# Patient Record
Sex: Male | Born: 1965 | Race: White | Hispanic: No | Marital: Single | Smoking: Former smoker
Health system: Southern US, Community
[De-identification: ages and names within clinical notes are randomized; demographics above are authoritative.]

## PROBLEM LIST (undated history)

## (undated) DIAGNOSIS — G250 Essential tremor: Secondary | ICD-10-CM

## (undated) HISTORY — PX: LUMBAR SPINE SURGERY: SHX701

## (undated) HISTORY — DX: Essential tremor: G25.0

---

## 2008-08-22 ENCOUNTER — Emergency Department (HOSPITAL_COMMUNITY): Admission: EM | Admit: 2008-08-22 | Discharge: 2008-08-22 | Payer: Self-pay | Admitting: Emergency Medicine

## 2018-11-17 ENCOUNTER — Other Ambulatory Visit: Payer: Self-pay

## 2018-11-17 DIAGNOSIS — Z20822 Contact with and (suspected) exposure to covid-19: Secondary | ICD-10-CM

## 2018-11-19 LAB — NOVEL CORONAVIRUS, NAA: SARS-CoV-2, NAA: NOT DETECTED

## 2018-12-23 ENCOUNTER — Other Ambulatory Visit: Payer: Self-pay | Admitting: Internal Medicine

## 2018-12-23 DIAGNOSIS — M5442 Lumbago with sciatica, left side: Secondary | ICD-10-CM

## 2019-01-06 ENCOUNTER — Ambulatory Visit
Admission: RE | Admit: 2019-01-06 | Discharge: 2019-01-06 | Disposition: A | Discharge status: Home / Self Care | Payer: PRIVATE HEALTH INSURANCE | Source: Ambulatory Visit | Attending: Internal Medicine | Admitting: Internal Medicine

## 2019-01-06 ENCOUNTER — Other Ambulatory Visit: Payer: Self-pay

## 2019-01-06 DIAGNOSIS — M5442 Lumbago with sciatica, left side: Secondary | ICD-10-CM | POA: Insufficient documentation

## 2019-02-04 ENCOUNTER — Encounter: Payer: Self-pay | Admitting: Neurology

## 2019-02-23 ENCOUNTER — Encounter: Payer: Self-pay | Admitting: Neurology

## 2019-02-23 NOTE — Progress Notes (Signed)
Stephen Austin was seen today in the movement disorders clinic for neurologic consultation at the request of Barnett Abu, MD.  The consultation is for the evaluation of tremor. The records that were made available to me were reviewed. Patient had lumbar spinal surgery at the end of October.  Following that surgery, he noted worsening of tremor in his left hand but pt states that it was always there.  He did have baseline tremor, and I noticed records from his primary care physician from September 9 that mentioned essential tremor.  In fact, he was put on propranolol several years ago, but felt it did not do anything and he discontinued the medication.  He was restarted on this by Dr. Danielle Dess on February 04, 2019.,  20 mg twice per day.  It is not helping.  No SE with the medication.    Tremor: Yes.     How long has it been going on? 5+ years  At rest or with activation?  activation  When is it noted the most?  Writing/eating/driving  Fam hx of tremor?  Yes.  , Mother  Located where?  L>R hand and he is L handed  Affected by caffeine:  No. (drinks 4 diet cokes per day)  Affected by alcohol:  Only drinks 1 time per month - not enough to know if changes tremor  Affected by stress:  Yes.    Affected by fatigue:  No.  Spills soup if on spoon:  Yes.    Spills glass of liquid if full: may or may not  Affects ADL's (tying shoes, brushing teeth, etc):  No. but shaving is a challenge (now shaving with electric because of it)  Tremor inducing meds:  No.  Neuroimaging of the brain has not previously been performed.    PREVIOUS MEDICATIONS: propranolol; tried something else but not sure what  ALLERGIES:   Allergies  Allergen Reactions  . Metoprolol Other (See Comments)    Wheezing    CURRENT MEDICATIONS:  Current Outpatient Medications  Medication Instructions  . celecoxib (CELEBREX) 200 MG capsule Oral  . meloxicam (MOBIC) 7.5 mg, Oral, Daily  . propranolol (INDERAL) 20 mg, Oral, 2 times daily     PAST MEDICAL HISTORY:   Past Medical History:  Diagnosis Date  . Essential tremor     PAST SURGICAL HISTORY:   Past Surgical History:  Procedure Laterality Date  . LUMBAR SPINE SURGERY      SOCIAL HISTORY:   Social History   Socioeconomic History  . Marital status: Single    Spouse name: Not on file  . Number of children: Not on file  . Years of education: Not on file  . Highest education level: Not on file  Occupational History  . Not on file  Social Needs  . Financial resource strain: Not on file  . Food insecurity    Worry: Not on file    Inability: Not on file  . Transportation needs    Medical: Not on file    Non-medical: Not on file  Tobacco Use  . Smoking status: Former Games developer  . Smokeless tobacco: Current User    Types: Snuff  Substance and Sexual Activity  . Alcohol use: Yes    Comment: 1 time per month  . Drug use: Not on file  . Sexual activity: Not on file  Lifestyle  . Physical activity    Days per week: Not on file    Minutes per session: Not on file  .  Stress: Not on file  Relationships  . Social Herbalist on phone: Not on file    Gets together: Not on file    Attends religious service: Not on file    Active member of club or organization: Not on file    Attends meetings of clubs or organizations: Not on file    Relationship status: Not on file  . Intimate partner violence    Fear of current or ex partner: Not on file    Emotionally abused: Not on file    Physically abused: Not on file    Forced sexual activity: Not on file  Other Topics Concern  . Not on file  Social History Narrative   Left handed    FAMILY HISTORY:   Family Status  Relation Name Status  . Mother  Alive  . Father  Deceased  . Brother  Alive  . Child 2 Alive    ROS:  Review of Systems  Constitutional: Negative.   HENT: Negative.   Eyes: Negative.   Respiratory: Negative.   Cardiovascular: Negative.   Gastrointestinal: Negative.    Genitourinary: Negative.   Musculoskeletal: Positive for back pain (improving since sx).  Skin: Negative.   Endo/Heme/Allergies: Negative.     PHYSICAL EXAMINATION:    VITALS:   Vitals:   02/24/19 1327  BP: 129/72  Pulse: 80  Resp: 16  SpO2: 98%  Weight: 222 lb 3.2 oz (100.8 kg)  Height: 5\' 10"  (1.778 m)    GEN:  The patient appears stated age and is in NAD. HEENT:  Normocephalic, atraumatic.  The mucous membranes are moist. The superficial temporal arteries are without ropiness or tenderness. CV:  RRR Lungs:  CTAB Neck/HEME:  There are no carotid bruits bilaterally.  Neurological examination:  Orientation: The patient is alert and oriented x3. Fund of knowledge is appropriate.  Recent and remote memory are intact.  Attention and concentration are normal.    Able to name objects and repeat phrases. Cranial nerves: There is good facial symmetry. . Extraocular muscles are intact. The visual fields are full to confrontational testing. The speech is fluent and clear. Soft palate rises symmetrically and there is no tongue deviation. Hearing is intact to conversational tone. Sensation: Sensation is intact to light and pinprick throughout (facial, trunk, extremities). Vibration is intact at the bilateral big toe. There is no extinction with double simultaneous stimulation. There is no sensory dermatomal level identified. Motor: Strength is 5/5 in the bilateral upper and lower extremities.   Shoulder shrug is equal and symmetric.  There is no pronator drift. Deep tendon reflexes: Deep tendon reflexes are 2/4 at the bilateral biceps, triceps, brachioradialis, patella and achilles. Plantar responses are downgoing bilaterally.  Movement examination: Tone: There is normal tone in the bilateral upper extremities.  The tone in the lower extremities is normal.  Abnormal movements: There is no rest tremor, even with distraction procedures.  There is mild postural tremor that can become moderate  on the left, if the arm is held in the proximal position.  He does have trouble with Archimedes spirals, especially on the left.  He has mild trouble pouring water from 1 glass to another. Coordination:  There is no decremation with RAM's, with any form of RAMS, including alternating supination and pronation of the forearm, hand opening and closing, finger taps, heel taps and toe taps. Gait and Station: The patient has no difficulty arising out of a deep-seated chair without the use of the hands.  The patient's stride length is good but gait is a bit antalgic from recent back sx.  Lab work is reviewed.  Chemistry on October 08, 2018 demonstrated sodium 139, potassium 4.1, chloride 102, CO2 26, BUN 12, creatinine 1.1, calcium 9.7, AST 21, ALT 29, glucose 88.  Last TSH was from 2015 and was 0.716.   ASSESSMENT/PLAN:  1.  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  -Discussed that essential tremor can be asymmetric in about 30% of cases.  I do not see any evidence of a neurodegenerative process.  He has had this for over 5 years.  -For now, would recommend continuing the propranolol, 20 mg twice per day.  This can be synergistic with primidone, which we are going to start.  -We will cautiously start primidone and work to 50 mg twice per day.  He was given a titration schedule.  Discussed risk, benefits, and side effects.  -Discussed DBS therapy, although I do not think that he necessarily needs that right now.  Was shown HIPAA compliant videos of patients who have had DBS.  I talked to the patient about the logistics associated with DBS therapy.  I talked to the patient about risks/benefits/side effects of DBS therapy.  We talked about risks which included but were not limited to infection, paralysis, intraoperative seizure, death, stroke, bleeding around the electrode.   I talked to patient about fiducial placement 1 week prior to DBS therapy.  I talked to the patient about what to expect in the operating  room, including the fact that this is an awake surgery.  We talked about battery placement as well as which is done under general anesthesia, generally approximately one week following the initial surgery.  We also talked about the fact that the patient will need to be off of medications for surgery.  The patient was given the opportunity to ask questions, which they did, and I answered them to the best of my ability today.   -Once the patient left, I realized that he had not had his TSH checked since 2015.  Would recommend doing this.  We will call him back and let him know.  2.  LBP  -s/p recent surgery with Dr. Danielle DessElsner.  He is slowly improving.  3.  Follow up is anticipated in the next 4-6 months, sooner should new neurologic issues arise.  Much greater than 50% of this visit was spent in counseling and coordinating care.  Total face to face time: 60 min  Cc:  Lauro RegulusAnderson, Marshall W, MD

## 2019-02-24 ENCOUNTER — Telehealth: Payer: Self-pay

## 2019-02-24 ENCOUNTER — Other Ambulatory Visit: Payer: Self-pay

## 2019-02-24 ENCOUNTER — Encounter: Payer: Self-pay | Admitting: Neurology

## 2019-02-24 ENCOUNTER — Ambulatory Visit: Payer: PRIVATE HEALTH INSURANCE | Admitting: Neurology

## 2019-02-24 VITALS — BP 129/72 | HR 80 | Resp 16 | Ht 70.0 in | Wt 222.2 lb

## 2019-02-24 DIAGNOSIS — G250 Essential tremor: Secondary | ICD-10-CM

## 2019-02-24 DIAGNOSIS — M545 Low back pain, unspecified: Secondary | ICD-10-CM

## 2019-02-24 MED ORDER — PRIMIDONE 50 MG PO TABS: 50.0000 mg | ORAL_TABLET | Freq: Two times a day (BID) | ORAL | 1 refills | Status: DC

## 2019-02-24 NOTE — Telephone Encounter (Signed)
-----   Message from Finney, DO sent at 02/24/2019  3:25 PM EST ----- Will you apologize to patient and tell him that I realized after he left that his TSH has not been checked since 2015 and would recommend checking that given tremor.  He can stop by at his convenience to get done.  Dx:  tremor

## 2019-02-24 NOTE — Patient Instructions (Signed)
Start primidone 50 mg - 1/2 tablet at bedtime for 1 week and then increase to 1 tablet for one month and if you are still having tremor you can then increase to 1 tablet twice per day.  Continue propranolol 20 mg twice per day  The physicians and staff at [facility] are committed to providing excellent care. You may receive a survey requesting feedback about your experience at our office. We strive to receive "very good" responses to the survey questions. If you feel that your experience would prevent you from giving the office a "very good " response, please contact our office to try to remedy the situation. We may be reached at [phone]. Thank you for taking the time out of your busy day to complete the survey.

## 2019-03-26 ENCOUNTER — Other Ambulatory Visit: Payer: Self-pay | Admitting: Neurological Surgery

## 2019-03-26 DIAGNOSIS — M5416 Radiculopathy, lumbar region: Secondary | ICD-10-CM

## 2019-04-01 ENCOUNTER — Ambulatory Visit
Admission: RE | Admit: 2019-04-01 | Discharge: 2019-04-01 | Disposition: A | Discharge status: Home / Self Care | Payer: PRIVATE HEALTH INSURANCE | Source: Ambulatory Visit | Attending: Neurological Surgery | Admitting: Neurological Surgery

## 2019-04-01 ENCOUNTER — Other Ambulatory Visit: Payer: Self-pay

## 2019-04-01 DIAGNOSIS — M5416 Radiculopathy, lumbar region: Secondary | ICD-10-CM | POA: Diagnosis present

## 2019-04-01 MED ORDER — GADOBUTROL 1 MMOL/ML IV SOLN
10.0000 mL | Freq: Once | INTRAVENOUS | Status: AC | PRN
  Administered 2019-04-01: 10 mL via INTRAVENOUS

## 2019-07-13 ENCOUNTER — Other Ambulatory Visit
Admission: RE | Admit: 2019-07-13 | Discharge: 2019-07-13 | Disposition: A | Discharge status: Home / Self Care | Payer: PRIVATE HEALTH INSURANCE | Source: Ambulatory Visit | Attending: Internal Medicine | Admitting: Internal Medicine

## 2019-07-13 DIAGNOSIS — R55 Syncope and collapse: Secondary | ICD-10-CM | POA: Insufficient documentation

## 2019-07-13 LAB — TROPONIN I (HIGH SENSITIVITY): Troponin I (High Sensitivity): <2 ng/L (ref <18)

## 2019-07-13 LAB — FIBRIN DERIVATIVES D-DIMER (ARMC ONLY): Fibrin derivatives D-dimer (ARMC): 247.56 ng/mL (FEU) (ref 0.00–499.00)

## 2019-07-15 ENCOUNTER — Encounter: Payer: Self-pay | Admitting: Neurology

## 2019-07-15 NOTE — Progress Notes (Signed)
Virtual Visit Via Video   The purpose of this virtual visit is to provide medical care while limiting exposure to the novel coronavirus.    Consent was obtained for video visit:  Yes.   Answered questions that patient had about telehealth interaction:  Yes.   I discussed the limitations, risks, security and privacy concerns of performing an evaluation and management service by telemedicine. I also discussed with the patient that there may be a patient responsible charge related to this service. The patient expressed understanding and agreed to proceed.  Pt location: Home Physician Location: home Name of referring provider:  Lauro Regulus, MD I connected with Baldo Daub at patients initiation/request on 07/16/2019 at  1:00 PM EDT by video enabled telemedicine application and verified that I am speaking with the correct person using two identifiers. Pt MRN:  [id number] Pt DOB:  1965-07-01 Video Participants:  Baldo Daub;    Assessment/Plan:   1.  ET, asymmetric, Left  -increased primidone, 50 mg, 3 in the AM (having trouble remembering bid).  Try this on weekend when not working.  R/B/SE were discussed.  The opportunity to ask questions was given and they were answered to the best of my ability.  The patient expressed understanding and willingness to follow the outlined treatment protocols.  -continue propranolol 20 mg bid, although told to monior BP.  This is being RX by pcp (not started by [place]).  Pt had recent syncopal episode  2.  Syncope  -following with PCP  -stress test abnormal.  Pending appt with cardiology.  Subjective   pt f/u for Essential tremor.  My previous records as well as any outside records made available were reviewed prior to todays visit.  On propranolol.  Primidone started last visit.  Reports that tremor is about the same..  Pt denies falls.   No hallucinations.  Mood has been good.  After pt left last visit, we called him back same day to tel him he needed  TSH checked.  Never did come and have that done, but pt doesn't recall needing to have it done..  I was able to pull and review labs from PCP from 07/11/19 but no tsh included.  States that he did have a syncopal episode on Friday.  He was sitting in his recliner and got up and used the restroom.  He felt dizzy.  He was in the process of urinating when it happened.  He passed out and got up from the floor and he passed out 2 more times.  He was referred to cardiology after abnormal stress.  I don't have a copy of that.  He doesn't know what his BP was.  EMS was contacted.  He states that he doesn't think propranolol is lowering BP - "I've been on this medication before without a problem"  Current movement d/o meds: Propranolol 20 mg bid Primidone 50 mg bid   Prior meds:  Propranolol, 20 mg bid   Current Outpatient Medications on File Prior to Visit  Medication Sig Dispense Refill  . primidone (MYSOLINE) 50 MG tablet Take 1 tablet (50 mg total) by mouth 2 (two) times daily. 180 tablet 1  . propranolol (INDERAL) 20 MG tablet Take 20 mg by mouth 2 (two) times daily.     No current facility-administered medications on file prior to visit.     Objective   Vitals:   07/15/19 1504  Weight: 205 lb (93 kg)  Height: 5\' 11"  (1.803 m)  GEN:  The patient appears stated age and is in NAD.  Neurological examination:  Orientation: The patient is alert and oriented x3. Cranial nerves: There is good facial symmetry. There is no facial hypomimia.  The speech is fluent and clear   Hearing is intact to conversational tone. Motor: Strength is at least antigravity x 4.   Shoulder shrug is equal and symmetric.  There is no pronator drift.  Movement examination: Tone: unable Abnormal movements: there is LUE postural and intention tremor.   Coordination:  There is no decremation with RAM's,     Follow up Instructions      -I discussed the assessment and treatment plan with the patient. The patient  was provided an opportunity to ask questions and all were answered. The patient agreed with the plan and demonstrated an understanding of the instructions.   The patient was advised to call back or seek an in-person evaluation if the symptoms worsen or if the condition fails to improve as anticipated.    Total time spent on today's visit was 30 minutes, including both face-to-face time and nonface-to-face time.  Time included that spent on review of records (prior notes available to me/labs/imaging if pertinent), discussing treatment and goals, answering patient's questions and coordinating care.   Alonza Bogus, DO

## 2019-07-16 ENCOUNTER — Telehealth (INDEPENDENT_AMBULATORY_CARE_PROVIDER_SITE_OTHER): Payer: PRIVATE HEALTH INSURANCE | Admitting: Neurology

## 2019-07-16 ENCOUNTER — Other Ambulatory Visit: Payer: Self-pay

## 2019-07-16 VITALS — Ht 71.0 in | Wt 205.0 lb

## 2019-07-16 DIAGNOSIS — R55 Syncope and collapse: Secondary | ICD-10-CM

## 2019-07-16 DIAGNOSIS — G250 Essential tremor: Secondary | ICD-10-CM

## 2019-07-16 DIAGNOSIS — R9439 Abnormal result of other cardiovascular function study: Secondary | ICD-10-CM | POA: Diagnosis not present

## 2019-07-17 NOTE — Addendum Note (Signed)
Addended by: Kandice Robinsons T on: 07/17/2019 09:32 AM   Modules accepted: Orders

## 2019-07-30 ENCOUNTER — Ambulatory Visit: Payer: PRIVATE HEALTH INSURANCE | Admitting: Dermatology

## 2019-08-04 ENCOUNTER — Ambulatory Visit: Payer: PRIVATE HEALTH INSURANCE | Admitting: Neurology

## 2019-08-04 ENCOUNTER — Other Ambulatory Visit: Payer: Self-pay

## 2019-08-04 NOTE — Telephone Encounter (Signed)
Received refill request. Contacted pharmacy to see when was the last time the medication was prescribed. He stated he did not have a record of this medication being filled in the last two years and the patient told him to send the request here.   Is it ok to refill propanolol 20mg  bid?

## 2019-08-05 NOTE — Telephone Encounter (Signed)
Rx NOT sent to pharmacy.

## 2019-08-25 ENCOUNTER — Other Ambulatory Visit: Payer: Self-pay | Admitting: Neurology

## 2019-09-09 ENCOUNTER — Other Ambulatory Visit: Payer: Self-pay

## 2019-09-09 ENCOUNTER — Ambulatory Visit (INDEPENDENT_AMBULATORY_CARE_PROVIDER_SITE_OTHER): Payer: PRIVATE HEALTH INSURANCE | Admitting: Dermatology

## 2019-09-09 DIAGNOSIS — L820 Inflamed seborrheic keratosis: Secondary | ICD-10-CM | POA: Diagnosis not present

## 2019-09-09 DIAGNOSIS — L578 Other skin changes due to chronic exposure to nonionizing radiation: Secondary | ICD-10-CM | POA: Diagnosis not present

## 2019-09-09 NOTE — Patient Instructions (Addendum)
Recommend daily broad spectrum sunscreen SPF 30+ to sun-exposed areas, reapply every 2 hours as needed. Call for new or changing lesions.  Cryotherapy Aftercare  . Wash gently with soap and water everyday.   . Apply Vaseline and Band-Aid daily until healed.  Seborrheic Keratosis  What causes seborrheic keratoses? Seborrheic keratoses are harmless, common skin growths that first appear during adult life.  As time goes by, more growths appear.  Some people may develop a large number of them.  Seborrheic keratoses appear on both covered and uncovered body parts.  They are not caused by sunlight.  The tendency to develop seborrheic keratoses can be inherited.  They vary in color from skin-colored to gray, brown, or even black.  They can be either smooth or have a rough, warty surface.   Seborrheic keratoses are superficial and look as if they were stuck on the skin.  Under the microscope this type of keratosis looks like layers upon layers of skin.  That is why at times the top layer may seem to fall off, but the rest of the growth remains and re-grows.    Treatment Seborrheic keratoses do not need to be treated, but can easily be removed in the office.  Seborrheic keratoses often cause symptoms when they rub on clothing or jewelry.  Lesions can be in the way of shaving.  If they become inflamed, they can cause itching, soreness, or burning.  Removal of a seborrheic keratosis can be accomplished by freezing, burning, or surgery. If any spot bleeds, scabs, or grows rapidly, please return to have it checked, as these can be an indication of a skin cancer.  

## 2019-09-09 NOTE — Progress Notes (Signed)
   New Patient Visit  Subjective  Stephen Austin is a 54 y.o. male who presents for the following: Skin Problem.  Patient here today for a spot on the forehead, present for greater then 1 year. Patient picks at it and it has gotten smaller. There is also a spot near right eye he would like looked at. No changes, hasn't drained.  No history of skin cancer.   The following portions of the chart were reviewed this encounter and updated as appropriate:      Review of Systems:  No other skin or systemic complaints except as noted in HPI or Assessment and Plan.  Objective  Well appearing patient in no apparent distress; mood and affect are within normal limits.  A focused examination was performed including face. Relevant physical exam findings are noted in the Assessment and Plan.  Objective  Right Glabella, right nasal root (2): Keratotic waxy stuck-on papule R glabella Small firm flesh waxy papule R nasal root   Assessment & Plan  Inflamed seborrheic keratosis (2) Right Glabella, right nasal root  Vs cyst at right nasal root  Destruction of lesion - Right Glabella, right nasal root  Destruction method: cryotherapy   Informed consent: discussed and consent obtained   Lesion destroyed using liquid nitrogen: Yes   Region frozen until ice ball extended beyond lesion: Yes   Outcome: patient tolerated procedure well with no complications   Post-procedure details: wound care instructions given    Actinic Damage - diffuse scaly erythematous macules with underlying dyspigmentation - Recommend daily broad spectrum sunscreen SPF 30+ to sun-exposed areas, reapply every 2 hours as needed.  - Call for new or changing lesions.  Return if symptoms worsen or fail to improve.  Anise Salvo, RMA, am acting as scribe for Willeen Niece, MD .  Documentation: I have reviewed the above documentation for accuracy and completeness, and I agree with the above.  Willeen Niece MD

## 2019-12-28 NOTE — Progress Notes (Signed)
Assessment/Plan:    1.  Essential Tremor  -Intended to have the patient take primidone, 3 tablets in the morning last visit, but he did not do that.  Told him to try various combinations of primidone, either 3 tablets in the morning, or if unable to tolerate that to try 3 tablets at night or 2 tablets in the morning and 1 tablet at night.  He will let me know how he ends up taking it.  -On propranolol, 20 mg.  It is prescribed for twice a day, but the patient is only taking it once per day.  He is prescribed this by primary care for tremor.  -Discussed DBS today along with the risks and benefits.  He has several questions and I answered those to the best of my ability. 2.  Syncope  -Wonder if propranolol could have contributed.  He and I talked about this again today.  Fortunately, he has had no further episodes and no near syncope either.  -Had stress echo that was abnormal, but cardiac CTA was fairly unremarkable.  -Seeing cardiology PA  -If recurs, the plan was to consider long-term Holter monitor Subjective:   Stephen Austin was seen today in follow up for essential tremor.  My previous records were reviewed prior to todays visit. Pt denies falls.  Pt denies lightheadedness, near syncope.  No hallucinations.  Mood has been good.  Tremor has been about the same.  Has significant difficulty writing.  At our last visit, the patient had had a syncopal episode.  He was being worked up for that.  He had an abnormal stress echo.  He was referred to cardiology.  He saw the PA.  Work-up was negative, including cardiac CTA.  Current prescribed movement disorder medications: Primidone, 50 mg, 3 tablets in the morning, increased last visit (patient has trouble remembering twice daily dosing) -patient only taking 2 tablets daily Propranolol, 20 mg twice daily (prescribed by primary care) - pt only taking one per day   ALLERGIES:   Allergies  Allergen Reactions   Metoprolol Other (See Comments)     Wheezing    CURRENT MEDICATIONS:  Outpatient Encounter Medications as of 01/04/2020  Medication Sig   primidone (MYSOLINE) 50 MG tablet TAKE 1 TABLET BY MOUTH TWICE A DAY   propranolol (INDERAL) 20 MG tablet Take 20 mg by mouth 2 (two) times daily.   No facility-administered encounter medications on file as of 01/04/2020.     Objective:    PHYSICAL EXAMINATION:    VITALS:   Vitals:   01/04/20 1356  BP: 122/70  Pulse: 81  SpO2: 99%  Weight: 218 lb (98.9 kg)  Height: 5\' 11"  (1.803 m)    GEN:  The patient appears stated age and is in NAD. HEENT:  Normocephalic, atraumatic.  The mucous membranes are moist. The superficial temporal arteries are without ropiness or tenderness. CV:  RRR Lungs:  CTAB Neck/HEME:  There are no carotid bruits bilaterally.  Neurological examination:  Orientation: The patient is alert and oriented x3. Cranial nerves: There is good facial symmetry. The speech is fluent and clear. Soft palate rises symmetrically and there is no tongue deviation. Hearing is intact to conversational tone. Sensation: Sensation is intact to light touch throughout Motor: Strength is at least antigravity x4.  Movement examination: Tone: There is normal tone in the UE/LE Abnormal movements: Mild tremor of the outstretched hands on the left.  Has trouble with Archimedes spirals on the left.  Has significant  difficulty when asked for handwriting sample. Coordination:  There is no decremation with RAM's    Total time spent on today's visit was 25 minutes, including both face-to-face time and nonface-to-face time.  Time included that spent on review of records (prior notes available to me/labs/imaging if pertinent), discussing treatment and goals, answering patient's questions and coordinating care.  Cc:  Lauro Regulus, MD

## 2020-01-04 ENCOUNTER — Encounter: Payer: Self-pay | Admitting: Neurology

## 2020-01-04 ENCOUNTER — Ambulatory Visit (INDEPENDENT_AMBULATORY_CARE_PROVIDER_SITE_OTHER): Payer: PRIVATE HEALTH INSURANCE | Admitting: Neurology

## 2020-01-04 ENCOUNTER — Other Ambulatory Visit: Payer: Self-pay

## 2020-01-04 VITALS — BP 122/70 | HR 81 | Ht 71.0 in | Wt 218.0 lb

## 2020-01-04 DIAGNOSIS — G250 Essential tremor: Secondary | ICD-10-CM | POA: Diagnosis not present

## 2020-01-04 MED ORDER — PRIMIDONE 50 MG PO TABS: 150.0000 mg | ORAL_TABLET | Freq: Every morning | ORAL | 5 refills | Status: DC

## 2020-01-04 NOTE — Patient Instructions (Addendum)
Take primidone, try 3 tablets at bedtime.  If that does not work, try various combinations of this - so you can take 3 tablets in the AM or 2 in the AM and 1 at night ,etc.  The physicians and staff at [facility] are committed to providing excellent care. You may receive a survey requesting feedback about your experience at our office. We strive to receive "very good" responses to the survey questions. If you feel that your experience would prevent you from giving the office a "very good " response, please contact our office to try to remedy the situation. We may be reached at [phone]. Thank you for taking the time out of your busy day to complete the survey.

## 2020-03-04 ENCOUNTER — Other Ambulatory Visit: Payer: Self-pay | Admitting: Neurology

## 2020-03-04 NOTE — Telephone Encounter (Signed)
Rx(s) sent to pharmacy electronically.  

## 2020-06-29 NOTE — Progress Notes (Signed)
    Assessment/Plan:    1.  Essential Tremor  Continue propranolol, 20 mg daily.  This is prescribed by primary care.  Do not really want to increase this because he has had a syncopal episode in the past and I wonder if propranolol contributed to it.  -slowly increase medication from 150 mg to 250 mg per day.    -Discussed in detail deep brain stimulation and focused ultrasound.  He does not think he is ready for that.  He could potentially be a focused ultrasound candidate given that symptoms are primarily on the left and he is left-hand dominant.  However, symptoms really are only mild to moderate.  -We compared Archimedes spirals currently to when we started treating in 2020 and they are definitely better than in the past.  This suggest that primidone is helping.  -If going up on the primidone does not help at all, perhaps we could try something like trihexyphenidyl, Zonegran or topiramate.  2.  History of syncope  -Cardiac work-up was negative.  -If recurs, the plan is to consider long-term Holter.  Subjective:   Stephen Austin was seen today in follow up for essential tremor.  My previous records were reviewed prior to todays visit. Pt notes tremor about the same.  It is still bothersome.  It is primarily in the left hand, but he is left-hand dominant.  No syncopal episodes.  Current prescribed movement disorder medications: Primidone, 50 mg, 3 tablets daily (increased last visit) Propranolol, 20 mg daily     ALLERGIES:   No Known Allergies  CURRENT MEDICATIONS:  Outpatient Encounter Medications as of 07/05/2020  Medication Sig  . primidone (MYSOLINE) 50 MG tablet TAKE 1 TABLET BY MOUTH TWICE A DAY (Patient taking differently: 3 (three) times daily. Takes all 3 in the am)  . propranolol (INDERAL) 20 MG tablet Take 20 mg by mouth 2 (two) times daily. Had been taken it daily   No facility-administered encounter medications on file as of 07/05/2020.     Objective:    PHYSICAL  EXAMINATION:    VITALS:   Vitals:   07/05/20 1516  BP: 102/62  Pulse: 63  SpO2: 99%  Weight: 209 lb 6.4 oz (95 kg)  Height: 5\' 10"  (1.778 m)    GEN:  The patient appears stated age and is in NAD. HEENT:  Normocephalic, atraumatic.  The mucous membranes are moist.  Neurological examination:  Orientation: The patient is alert and oriented x3. Cranial nerves: There is good facial symmetry. The speech is fluent and clear. Soft palate rises symmetrically and there is no tongue deviation. Hearing is intact to conversational tone. Sensation: Sensation is intact to light touch throughout Motor: Strength is at least antigravity x4.  Movement examination: Tone: There is normal tone in the UE/LE Abnormal movements: No rest tremor.  He has mild to moderate postural tremor on the left.  Trouble with Archimedes spirals on the left. Coordination:  There is no decremation with RAM's Gait and Station: The patient has no difficulty arising out of a deep-seated chair without the use of the hands. The patient's stride length is good   Total time spent on today's visit was 20 minutes, including both face-to-face time and nonface-to-face time.  Time included that spent on review of records (prior notes available to me/labs/imaging if pertinent), discussing treatment and goals, answering patient's questions and coordinating care.  Cc:  , MD

## 2020-07-05 ENCOUNTER — Encounter: Payer: Self-pay | Admitting: Neurology

## 2020-07-05 ENCOUNTER — Ambulatory Visit (INDEPENDENT_AMBULATORY_CARE_PROVIDER_SITE_OTHER): Payer: PRIVATE HEALTH INSURANCE | Admitting: Neurology

## 2020-07-05 ENCOUNTER — Other Ambulatory Visit: Payer: Self-pay

## 2020-07-05 VITALS — BP 102/62 | HR 63 | Ht 70.0 in | Wt 209.4 lb

## 2020-07-05 DIAGNOSIS — G250 Essential tremor: Secondary | ICD-10-CM | POA: Diagnosis not present

## 2020-07-05 MED ORDER — PRIMIDONE 250 MG PO TABS: 250.0000 mg | ORAL_TABLET | Freq: Every day | ORAL | 1 refills | Status: DC

## 2020-07-05 NOTE — Patient Instructions (Addendum)
Week 1:  Take primidone, 250 mg, 1/2 tablet in the AM for a week  Week 2 and beyond:  Take primdone, 250 mg, 1 in the AM  If above doesn't work, we can try to add another medication or consider surgeries if you think it is bad enough to do so  The physicians and staff at [facility] are committed to providing excellent care. You may receive a survey requesting feedback about your experience at our office. We strive to receive "very good" responses to the survey questions. If you feel that your experience would prevent you from giving the office a "very good " response, please contact our office to try to remedy the situation. We may be reached at [phone]. Thank you for taking the time out of your busy day to complete the survey.

## 2020-12-22 ENCOUNTER — Ambulatory Visit: Payer: PRIVATE HEALTH INSURANCE | Admitting: Neurology

## 2021-01-04 NOTE — Progress Notes (Signed)
    Assessment/Plan:    1.  Essential Tremor  Continue propranolol, 20 mg daily.  This is prescribed by primary care.  Do not really want to increase this because he has had a syncopal episode in the past and I wonder if propranolol contributed to it.  -continue primidone 250 mg q hs  -Discussed in detail deep brain stimulation and focused ultrasound.  He does not think he is ready for that.  He could potentially be a focused ultrasound candidate given that symptoms are primarily on the left and he is left-hand dominant.  However, symptoms really are only mild to moderate.  -We discussed multiple options today, as he is unhappy with tremor control.  We discussed trihexyphenidyl, topiramate, gabapentin.  He patient states he has tried gabapentin for other reasons and really noticed no difference in the tremor.  Ultimately, after discussing risk, benefits, side effects, we decided to try trihexyphenidyl, 2 mg twice per day.  We did discuss in detail dry eyes/dry mouth.  2.  History of syncope  -Cardiac work-up was negative.  -If recurs, the plan is to consider long-term Holter.  Subjective:   Stephen Austin was seen today in follow up for essential tremor.  My previous records were reviewed prior to todays visit. Pt notes tremor about the same.  It is still bothersome.  It is primarily in the left hand, but he is left-hand dominant.  No syncopal episodes.  Tremor is "same, maybe a little worse."2  Current prescribed movement disorder medications: Primidone,250 mg daily (increased from 150 mg q day) Propranolol, 20 mg daily     ALLERGIES:   No Known Allergies  CURRENT MEDICATIONS:  Outpatient Encounter Medications as of 01/05/2021  Medication Sig   primidone (MYSOLINE) 250 MG tablet Take 1 tablet (250 mg total) by mouth daily.   propranolol (INDERAL) 20 MG tablet Take 20 mg by mouth 2 (two) times daily. Had been taken it daily   No facility-administered encounter medications on file as  of 01/05/2021.     Objective:    PHYSICAL EXAMINATION:    VITALS:   Vitals:   01/05/21 1519  BP: 115/80  Pulse: 78  SpO2: 99%  Weight: 215 lb 6.4 oz (97.7 kg)  Height: 5\' 10"  (1.778 m)     GEN:  The patient appears stated age and is in NAD. HEENT:  Normocephalic, atraumatic.  The mucous membranes are moist.  Neurological examination:  Orientation: The patient is alert and oriented x3. Cranial nerves: There is good facial symmetry. The speech is fluent and clear. Soft palate rises symmetrically and there is no tongue deviation. Hearing is intact to conversational tone. Sensation: Sensation is intact to light touch throughout Motor: Strength is at least antigravity x4.  Movement examination: Tone: There is normal tone in the UE/LE Abnormal movements: No rest tremor.  He has mild to moderate postural tremor on the left.  Just slight trouble with Archimedes spirals on left Coordination:  There is no decremation with RAM's    Total time spent on today's visit was 20 minutes, including both face-to-face time and nonface-to-face time.  Time included that spent on review of records (prior notes available to me/labs/imaging if pertinent), discussing treatment and goals, answering patient's questions and coordinating care.  Cc:  , MD

## 2021-01-05 ENCOUNTER — Ambulatory Visit (INDEPENDENT_AMBULATORY_CARE_PROVIDER_SITE_OTHER): Payer: No Typology Code available for payment source | Admitting: Neurology

## 2021-01-05 ENCOUNTER — Encounter: Payer: Self-pay | Admitting: Neurology

## 2021-01-05 ENCOUNTER — Other Ambulatory Visit: Payer: Self-pay

## 2021-01-05 VITALS — BP 115/80 | HR 78 | Ht 70.0 in | Wt 215.4 lb

## 2021-01-05 DIAGNOSIS — G250 Essential tremor: Secondary | ICD-10-CM

## 2021-01-05 MED ORDER — PRIMIDONE 250 MG PO TABS: 250.0000 mg | ORAL_TABLET | Freq: Every day | ORAL | 1 refills | Status: DC

## 2021-01-05 MED ORDER — TRIHEXYPHENIDYL HCL 2 MG PO TABS
2.0000 mg | ORAL_TABLET | Freq: Two times a day (BID) | ORAL | 1 refills | Status: DC
Start: 2021-01-05 — End: 2022-04-04

## 2021-01-05 NOTE — Patient Instructions (Signed)
The physicians and staff at [facility] are committed to providing excellent care. You may receive a survey requesting feedback about your experience at our office. We strive to receive "very good" responses to the survey questions. If you feel that your experience would prevent you from giving the office a "very good " response, please contact our office to try to remedy the situation. We may be reached at [phone]. Thank you for taking the time out of your busy day to complete the survey.  

## 2021-05-22 IMAGING — MR MR LUMBAR SPINE WO/W CM
6 of 7 series · 31 of 48 positions shown · IV contrast (10ml Gadavist)
Comparison: Lumbar MRI 01/06/2019 and radiographs 02/04/2019

CLINICAL DATA: Low back pain radiating into the right leg since
back surgery 2 months ago. Surgery leave the left-sided pain.

EXAM:
MRI LUMBAR SPINE WITHOUT AND WITH CONTRAST
TECHNIQUE: Multiplanar and multiecho pulse sequences of the lumbar spine were
obtained without and with intravenous contrast.
CONTRAST:  10mL GADAVIST GADOBUTROL 1 MMOL/ML IV SOLN

[Series 5: T2 · sagittal · 4.0mm · 0.81mm/px · 5 of 17 slices shown (1 of 2)]
[im 1/17]
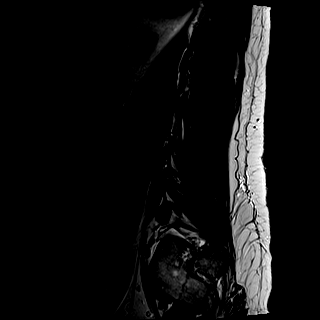
[im 5/17]
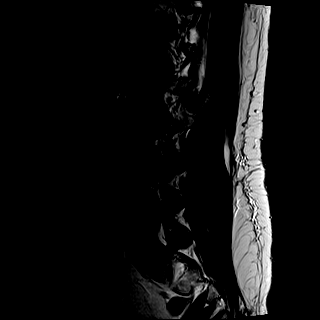
[im 9/17]
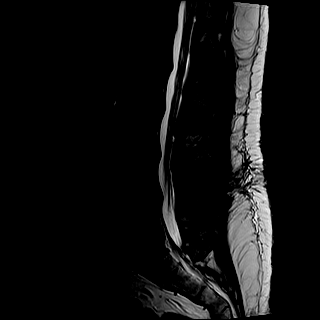
[im 13/17]
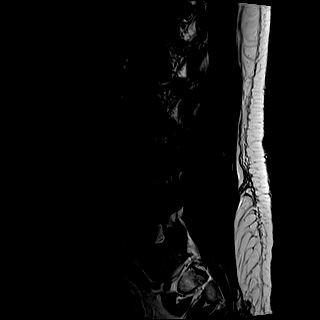
[im 17/17]
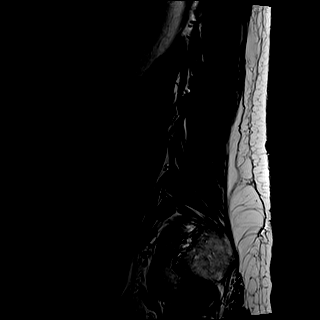

[Series 6: T1 · sagittal · 4.0mm · 0.81mm/px · 5 of 17 slices shown (1 of 2)]
[im 1/17]
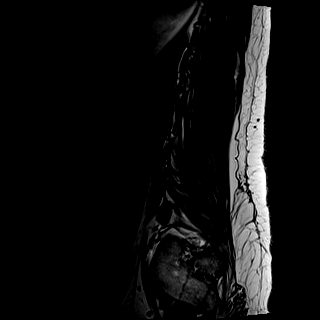
[im 5/17]
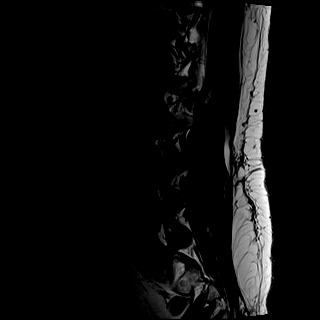
[im 9/17]
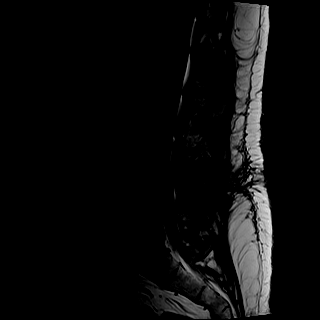
[im 13/17]
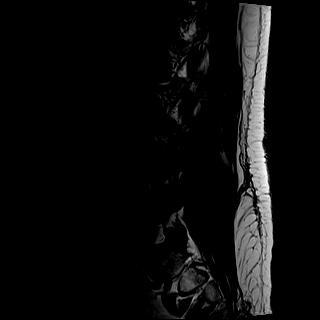
[im 17/17]
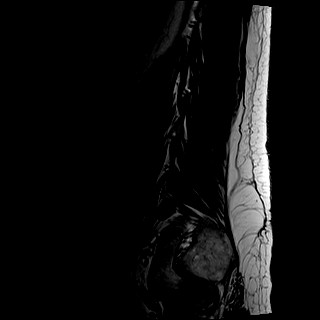

[Series 7: STIR · sagittal · 4.0mm · 0.41mm/px · 1 of 17 slices shown]
[im 1/17]
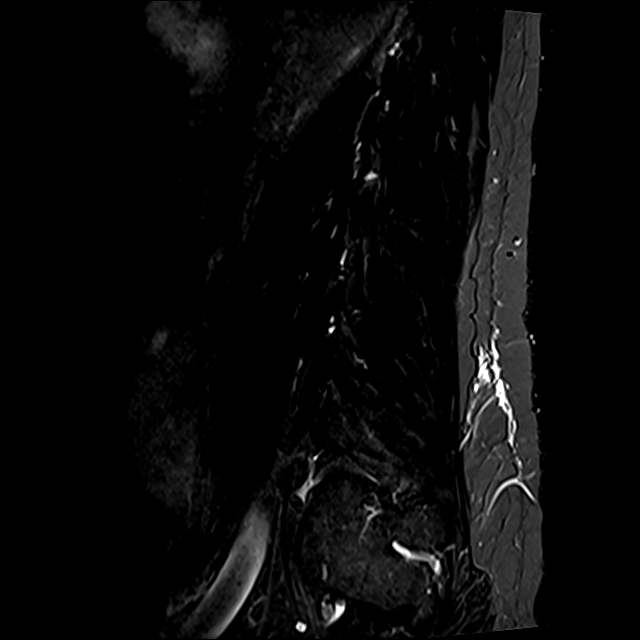

[Series 8: T2 · axial · 4.0mm · 0.78mm/px · z∈[-29,+186]mm · 8 of 38 slices shown (2 of 2)]
[im 1/38]
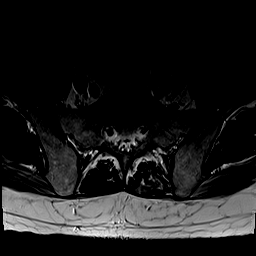
[im 5/38]
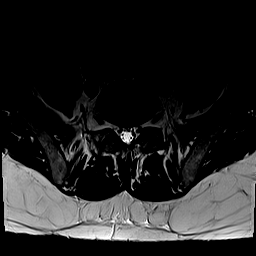
[im 13/38]
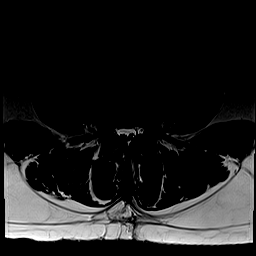
[im 17/38]
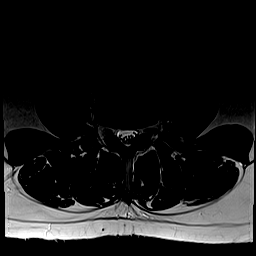
[im 21/38]
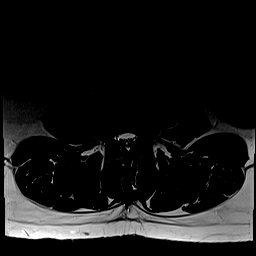
[im 25/38]
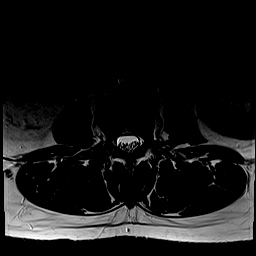
[im 33/38]
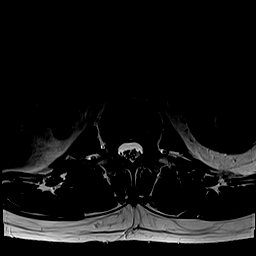
[im 38/38]
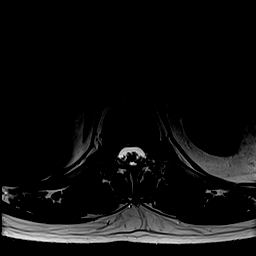

[Series 9: T1 · axial · 4.0mm · 0.39mm/px · z∈[-29,+186]mm · 8 of 38 slices shown (2 of 2)]
[im 1/38]
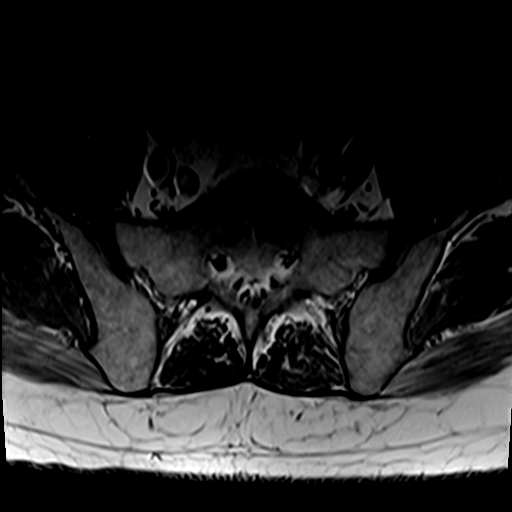
[im 5/38]
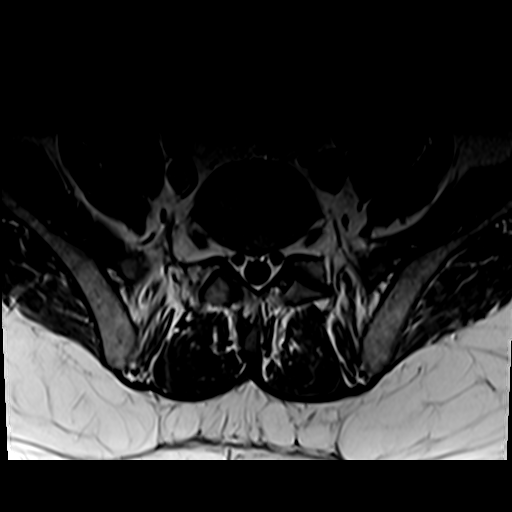
[im 13/38]
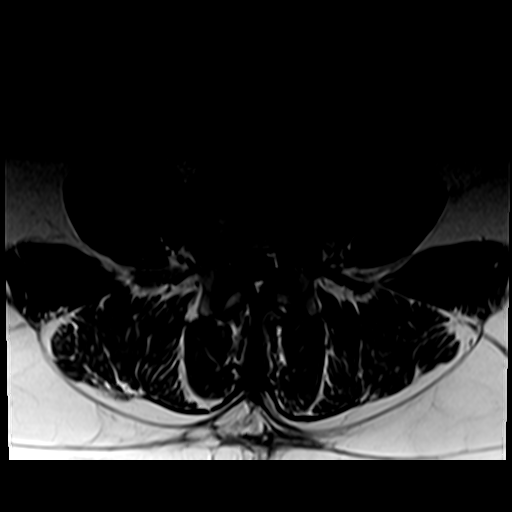
[im 17/38]
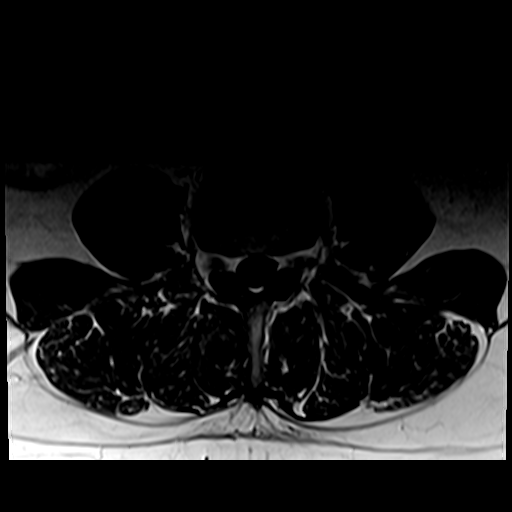
[im 21/38]
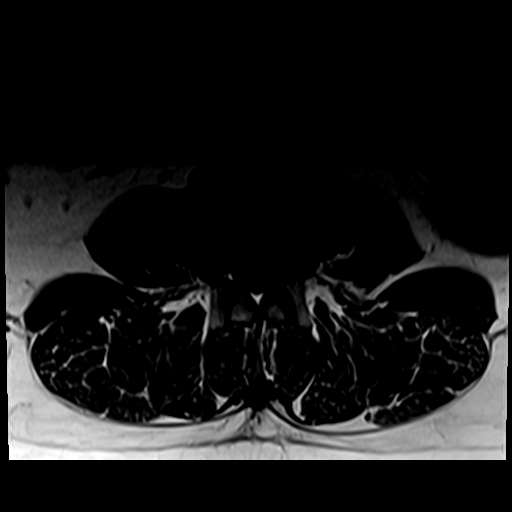
[im 25/38]
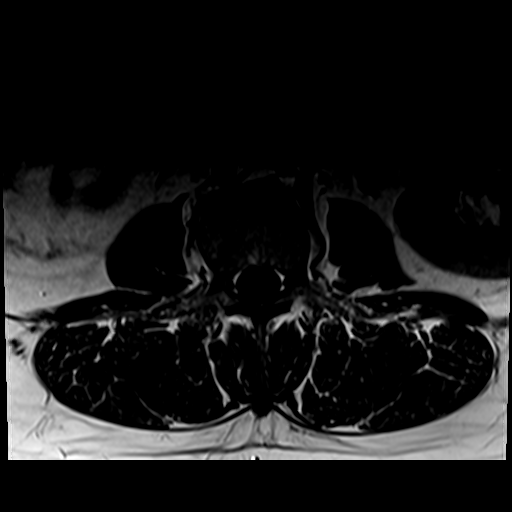
[im 33/38]
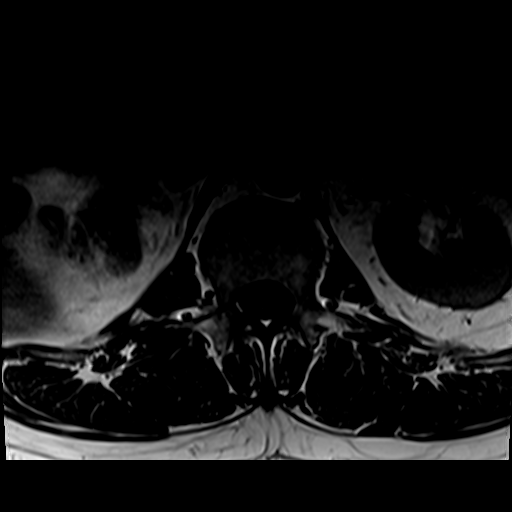
[im 38/38]
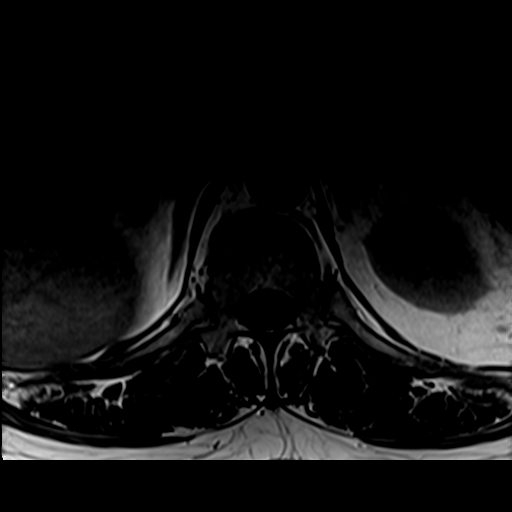

[Series 10: T1 fat-sat post-contrast · sagittal · 4.0mm · 0.81mm/px · 4 of 17 slices shown]
[im 1/17]
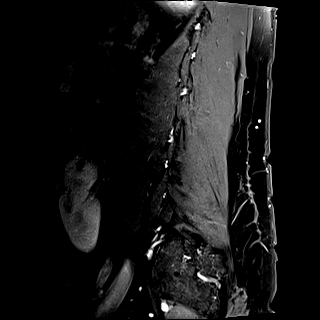
[im 6/17]
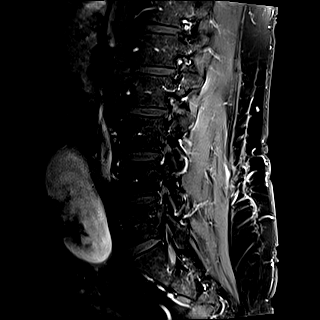
[im 11/17]
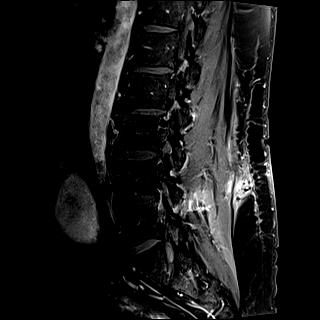
[im 17/17]
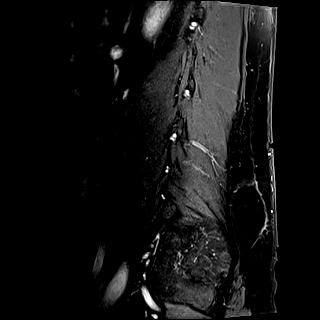

[31 of 48 positions shown; findings below may reference images not displayed]

FINDINGS: Segmentation: 5 lumbar type vertebral bodies are again assumed as on
previous imaging.

Alignment: Normal.

Vertebrae: No worrisome osseous lesion, acute fracture or pars
defect. Interval laminotomy on the left at L4-5 without suspicious
marrow edema or enhancement.The lumbar pedicles are short on a
congenital basis. There are scattered Schmorl's nodes which are
stable. The visualized sacroiliac joints appear unremarkable.

Conus medullaris: Extends to the T12-L1 level and appears normal. No
abnormal intradural enhancement.

Paraspinal and other soft tissues: No significant paraspinal
findings. Ectopic pelvic right kidney again noted.

Disc levels:

L1-2: Stable left paracentral disc protrusion. No nerve root
encroachment.

L2-3: Stable disc bulging and facet hypertrophy contributing to mild
spinal stenosis and mild narrowing of both lateral recesses. No
foraminal narrowing or nerve root encroachment.

L3-4: Stable annular disc bulging with a broad-based left greater
than right extraforaminal disc protrusions, facet and ligamentous
hypertrophy. Stable mild spinal stenosis and mild narrowing of the
lateral recesses and foramina.

L4-5: Interval left laminotomy and discectomy with expected mild
epidural enhancement in the left lateral recess. Stable mild disc
bulging without recurrent disc herniation. There is stable mild
narrowing of the right lateral recess without L5 nerve root
encroachment. The foramina remain patent.

L5-S1: Normal interspace.
IMPRESSION: 1. Interval left laminotomy and discectomy at L4-5. Stable mild
narrowing of the right lateral recess without L5 nerve root
encroachment.
2. Stable mild multifactorial spinal stenosis at L2-3 and L3-4.
3. No acute findings or explanation for the patient's symptoms.
Specifically, no definite right-sided nerve root encroachment
identified.

## 2021-07-13 ENCOUNTER — Encounter: Payer: Self-pay | Admitting: Neurology

## 2021-07-13 ENCOUNTER — Ambulatory Visit (INDEPENDENT_AMBULATORY_CARE_PROVIDER_SITE_OTHER): Payer: PRIVATE HEALTH INSURANCE | Admitting: Neurology

## 2021-07-13 VITALS — BP 100/70 | HR 72 | Ht 70.0 in | Wt 204.0 lb

## 2021-07-13 DIAGNOSIS — G250 Essential tremor: Secondary | ICD-10-CM

## 2021-07-13 MED ORDER — TOPIRAMATE 100 MG PO TABS: 100.0000 mg | ORAL_TABLET | Freq: Every day | ORAL | 1 refills | Status: DC

## 2021-07-13 MED ORDER — TOPIRAMATE 25 MG PO TABS: ORAL_TABLET | ORAL | 0 refills | Status: DC

## 2021-07-13 NOTE — Progress Notes (Signed)
? ? ?  Assessment/Plan:  ? ? ?1.  Essential Tremor ? Continue propranolol, 20 mg daily.  This is prescribed by primary care.  Do not really want to increase this because he has had a syncopal episode in the past and I wonder if propranolol contributed to it. ? -continue primidone 250 mg q hs ? -he wants to trial topamax.  Discussed r/b/se.  We will work to 100 mg daily. ? -Discussed in detail deep brain stimulation and focused ultrasound again.  While tremor isn't severe, he is bothered by it.  Its mostly the L hand. He could potentially be a focused ultrasound candidate given that symptoms are primarily on the left and he is left-hand dominant.   ? -discussed cala trio and information given on that ? ? ?2.  History of syncope ? -Cardiac work-up was negative. ? -If recurs, the plan is to consider long-term Holter. ? ?Subjective:  ? ?Stephen Austin was seen today in follow up for essential tremor.  My previous records were reviewed prior to todays visit. Pt notes tremor about the same.  Trihexyphenidyl was started last visit, addition to his propranolol and propranolol.  He reports that he isn't taking that b/c of dry mouth.  He asks about topamax.   ? ?Current prescribed movement disorder medications: ?Primidone,250 mg daily  ?Propranolol, 20 mg daily ?Trihexyphenidyl, 2 mg twice per day. ? ?Prior meds:  artane (dry mouth) ? ?ALLERGIES:   ?No Known Allergies ? ?CURRENT MEDICATIONS:  ?Outpatient Encounter Medications as of 07/13/2021  ?Medication Sig  ? primidone (MYSOLINE) 250 MG tablet Take 1 tablet (250 mg total) by mouth daily.  ? propranolol (INDERAL) 20 MG tablet Take 20 mg by mouth 2 (two) times daily. Had been taken it daily  ? trihexyphenidyl (ARTANE) 2 MG tablet Take 1 tablet (2 mg total) by mouth 2 (two) times daily with a meal. (Patient not taking: Reported on 07/13/2021)  ? ?No facility-administered encounter medications on file as of 07/13/2021.  ? ? ? ?Objective:  ? ? ?PHYSICAL EXAMINATION:   ? ?VITALS:    ?Vitals:  ? 07/13/21 1444  ?BP: 100/70  ?Pulse: 72  ?SpO2: 94%  ?Weight: 204 lb (92.5 kg)  ?Height: 5\' 10"  (1.778 m)  ? ? ? ?GEN:  The patient appears stated age and is in NAD. ?HEENT:  Normocephalic, atraumatic.  The mucous membranes are moist. ? ?Neurological examination: ? ?Orientation: The patient is alert and oriented x3. ?Cranial nerves: There is good facial symmetry. The speech is fluent and clear. Soft palate rises symmetrically and there is no tongue deviation. Hearing is intact to conversational tone. ?Sensation: Sensation is intact to light touch throughout ?Motor: Strength is at least antigravity x4. ? ?Movement examination: ?Tone: There is normal tone in the UE/LE ?Abnormal movements: No rest tremor.  He has mild to moderate postural tremor on the left.  This did not change when he was given a weight.  Just slight trouble with Archimedes spirals on left ?Coordination:  There is no decremation with RAM's ? ? ? ?Total time spent on today's visit was 35 minutes, including both face-to-face time and nonface-to-face time.  Time included that spent on review of records (prior notes available to me/labs/imaging if pertinent), discussing treatment and goals, answering patient's questions and coordinating care. ? ?Cc:  Kirk Ruths, MD ? ?

## 2021-07-13 NOTE — Patient Instructions (Signed)
Take topamax, 25 mg, 1 tablet daily for 1 week, then 2 tablets daily for 1 week and then 3 tablets daily for 1 week, then you will fill the 100 mg dosage and take 1 tablet daily ? ?Continue primidone 250 mg daily ? ?Continue propranolol 20 mg daily ? ?

## 2021-07-14 ENCOUNTER — Other Ambulatory Visit: Payer: Self-pay | Admitting: Neurology

## 2021-08-06 ENCOUNTER — Other Ambulatory Visit: Payer: Self-pay | Admitting: Neurology

## 2021-11-29 ENCOUNTER — Telehealth: Payer: Self-pay | Admitting: Neurology

## 2021-11-29 NOTE — Telephone Encounter (Signed)
Patient wants to know if Dr Tat will refill his propranolol he has not seen his PCP since he started coming here and is out of medication   [facility]

## 2021-11-30 NOTE — Telephone Encounter (Signed)
Patient called back and said [facility] told him we prescribed this medication last time but after researching this we only prescribed Primidone.

## 2021-11-30 NOTE — Telephone Encounter (Signed)
Called patient and he is out of refills and is going to call PCP to see if they can refill this medication. I told patient that Dr. Arbutus Leas would like this prescription to stay with the PCP

## 2022-01-14 ENCOUNTER — Other Ambulatory Visit: Payer: Self-pay | Admitting: Neurology

## 2022-01-16 NOTE — Progress Notes (Unsigned)
Assessment/Plan:    1.  Essential Tremor  Continue propranolol, 20 mg daily.  This is prescribed by primary care.  Do not really want to increase this because he has had a syncopal episode in the past and I wonder if propranolol contributed to it.  -continue primidone 250 mg q hs  -he wants to trial topamax.  Discussed r/b/se.  We will work to 100 mg daily.  -Discussed in detail deep brain stimulation and focused ultrasound again.  While tremor isn't severe, he is bothered by it.  Its mostly the L hand. He could potentially be a focused ultrasound candidate given that symptoms are primarily on the left and he is left-hand dominant.    -discussed cala trio and information given on that   2.  History of syncope  -Cardiac work-up was negative.  -If recurs, the plan is to consider long-term Holter.  Subjective:   Stephen Austin was seen today in follow up for essential tremor.  My previous records were reviewed prior to todays visit. Pt notes tremor about the same.  Topiramate started last visit, in addition to the medications he was already on.  Separate from the above, patient was in the emergent care in July.  He was installing a fuel tank and stood up too fast and hit his head and ended up with a scalp laceration.  This required staples, but he ultimately did well.  Current prescribed movement disorder medications: Primidone,250 mg daily  Propranolol, 20 mg daily Trihexyphenidyl, 2 mg twice per day. Topamax, 100 mg daily (started last visit)  Prior meds:  artane (dry mouth)  ALLERGIES:   No Known Allergies  CURRENT MEDICATIONS:  Outpatient Encounter Medications as of 01/18/2022  Medication Sig   primidone (MYSOLINE) 250 MG tablet TAKE 1 TABLET BY MOUTH EVERY DAY   propranolol (INDERAL) 20 MG tablet Take 20 mg by mouth 2 (two) times daily. Had been taken it daily   topiramate (TOPAMAX) 100 MG tablet Take 1 tablet (100 mg total) by mouth daily.   topiramate (TOPAMAX) 25 MG  tablet 1 po qd x 1 week; 2 po q day x 1 week; 3 po q day x 1 week   trihexyphenidyl (ARTANE) 2 MG tablet Take 1 tablet (2 mg total) by mouth 2 (two) times daily with a meal. (Patient not taking: Reported on 07/13/2021)   No facility-administered encounter medications on file as of 01/18/2022.     Objective:    PHYSICAL EXAMINATION:    VITALS:   There were no vitals filed for this visit.    GEN:  The patient appears stated age and is in NAD. HEENT:  Normocephalic, atraumatic.  The mucous membranes are moist.  Neurological examination:  Orientation: The patient is alert and oriented x3. Cranial nerves: There is good facial symmetry. The speech is fluent and clear. Soft palate rises symmetrically and there is no tongue deviation. Hearing is intact to conversational tone. Sensation: Sensation is intact to light touch throughout Motor: Strength is at least antigravity x4.  Movement examination: Tone: There is normal tone in the UE/LE Abnormal movements: No rest tremor.  He has mild to moderate postural tremor on the left.  This did not change when he was given a weight.  Just slight trouble with Archimedes spirals on left Coordination:  There is no decremation with RAM's    Total time spent on today's visit was 35 minutes, including both face-to-face time and nonface-to-face time.  Time included that spent  on review of records (prior notes available to me/labs/imaging if pertinent), discussing treatment and goals, answering patient's questions and coordinating care.  Cc:  Lauro Regulus, MD

## 2022-01-18 ENCOUNTER — Ambulatory Visit (INDEPENDENT_AMBULATORY_CARE_PROVIDER_SITE_OTHER): Payer: PRIVATE HEALTH INSURANCE | Admitting: Neurology

## 2022-01-18 VITALS — BP 117/78 | HR 74 | Ht 70.0 in | Wt 197.8 lb

## 2022-01-18 DIAGNOSIS — G250 Essential tremor: Secondary | ICD-10-CM | POA: Diagnosis not present

## 2022-01-22 ENCOUNTER — Encounter: Payer: Self-pay | Admitting: Neurology

## 2022-01-23 ENCOUNTER — Other Ambulatory Visit: Payer: Self-pay

## 2022-01-23 DIAGNOSIS — R251 Tremor, unspecified: Secondary | ICD-10-CM

## 2022-04-04 ENCOUNTER — Ambulatory Visit (INDEPENDENT_AMBULATORY_CARE_PROVIDER_SITE_OTHER): Payer: PRIVATE HEALTH INSURANCE | Admitting: Dermatology

## 2022-04-04 DIAGNOSIS — L820 Inflamed seborrheic keratosis: Secondary | ICD-10-CM

## 2022-04-04 DIAGNOSIS — L578 Other skin changes due to chronic exposure to nonionizing radiation: Secondary | ICD-10-CM | POA: Diagnosis not present

## 2022-04-04 DIAGNOSIS — L570 Actinic keratosis: Secondary | ICD-10-CM | POA: Diagnosis not present

## 2022-04-04 DIAGNOSIS — L821 Other seborrheic keratosis: Secondary | ICD-10-CM | POA: Diagnosis not present

## 2022-04-04 NOTE — Patient Instructions (Addendum)
Cryotherapy Aftercare  Wash gently with soap and water everyday.   Apply Vaseline and Band-Aid daily until healed.    Recommend daily broad spectrum sunscreen SPF 30+ to sun-exposed areas, reapply every 2 hours as needed. Call for new or changing lesions.  Staying in the shade or wearing long sleeves, sun glasses (UVA+UVB protection) and wide brim hats (4-inch brim around the entire circumference of the hat) are also recommended for sun protection.    Actinic keratoses are precancerous spots that appear secondary to cumulative UV radiation exposure/sun exposure over time. They are chronic with expected duration over 1 year. A portion of actinic keratoses will progress to squamous cell carcinoma of the skin. It is not possible to reliably predict which spots will progress to skin cancer and so treatment is recommended to prevent development of skin cancer.  Recommend daily broad spectrum sunscreen SPF 30+ to sun-exposed areas, reapply every 2 hours as needed.  Recommend staying in the shade or wearing long sleeves, sun glasses (UVA+UVB protection) and wide brim hats (4-inch brim around the entire circumference of the hat). Call for new or changing lesions.   Seborrheic Keratosis  What causes seborrheic keratoses? Seborrheic keratoses are harmless, common skin growths that first appear during adult life.  As time goes by, more growths appear.  Some people may develop a large number of them.  Seborrheic keratoses appear on both covered and uncovered body parts.  They are not caused by sunlight.  The tendency to develop seborrheic keratoses can be inherited.  They vary in color from skin-colored to gray, brown, or even black.  They can be either smooth or have a rough, warty surface.   Seborrheic keratoses are superficial and look as if they were stuck on the skin.  Under the microscope this type of keratosis looks like layers upon layers of skin.  That is why at times the top layer may seem to fall  off, but the rest of the growth remains and re-grows.    Treatment Seborrheic keratoses do not need to be treated, but can easily be removed in the office.  Seborrheic keratoses often cause symptoms when they rub on clothing or jewelry.  Lesions can be in the way of shaving.  If they become inflamed, they can cause itching, soreness, or burning.  Removal of a seborrheic keratosis can be accomplished by freezing, burning, or surgery. If any spot bleeds, scabs, or grows rapidly, please return to have it checked, as these can be an indication of a skin cancer.   Due to recent changes in healthcare laws, you may see results of your pathology and/or laboratory studies on MyChart before the doctors have had a chance to review them. We understand that in some cases there may be results that are confusing or concerning to you. Please understand that not all results are received at the same time and often the doctors may need to interpret multiple results in order to provide you with the best plan of care or course of treatment. Therefore, we ask that you please give [place] 2 business days to thoroughly review all your results before contacting the office for clarification. Should we see a critical lab result, you will be contacted sooner.   If You Need Anything After Your Visit  If you have any questions or concerns for your doctor, please call our main line at [phone] and press option 4 to reach your doctor's medical assistant. If no one answers, please leave a voicemail as directed  and we will return your call as soon as possible. Messages left after 4 pm will be answered the following business day.   You may also send [place] a message via [place]. We typically respond to MyChart messages within 1-2 business days.  For prescription refills, please ask your pharmacy to contact our office. Our fax number is [phone].  If you have an urgent issue when the clinic is closed that cannot wait until the next  business day, you can page your doctor at the number below.    Please note that while we do our best to be available for urgent issues outside of office hours, we are not available 24/7.   If you have an urgent issue and are unable to reach [place], you may choose to seek medical care at your doctor's office, retail clinic, urgent care center, or emergency room.  If you have a medical emergency, please immediately call 911 or go to the emergency department.  Pager Numbers  - Dr. Nehemiah Massed: [phone]  - Dr. Laurence Ferrari: [phone]  - Dr. Nicole Kindred: [phone]  In the event of inclement weather, please call our main line at [phone] for an update on the status of any delays or closures.  Dermatology Medication Tips: Please keep the boxes that topical medications come in in order to help keep track of the instructions about where and how to use these. Pharmacies typically print the medication instructions only on the boxes and not directly on the medication tubes.   If your medication is too expensive, please contact our office at [phone] option 4 or send [place] a message through [place].   We are unable to tell what your co-pay for medications will be in advance as this is different depending on your insurance coverage. However, we may be able to find a substitute medication at lower cost or fill out paperwork to get insurance to cover a needed medication.   If a prior authorization is required to get your medication covered by your insurance company, please allow [place] 1-2 business days to complete this process.  Drug prices often vary depending on where the prescription is filled and some pharmacies may offer cheaper prices.  The website www.goodrx.com contains coupons for medications through different pharmacies. The prices here do not account for what the cost may be with help from insurance (it may be cheaper with your insurance), but the website can give you the price if you did not use  any insurance.  - You can print the associated coupon and take it with your prescription to the pharmacy.  - You may also stop by our office during regular business hours and pick up a GoodRx coupon card.  - If you need your prescription sent electronically to a different pharmacy, notify our office through [facility] or by phone at [phone] option 4.     Si Usted Necesita Algo Despus de Su Visita  Tambin puede enviarnos un mensaje a travs de Pharmacist, community. Por lo general respondemos a los mensajes de MyChart en el transcurso de 1 a 2 das hbiles.  Para renovar recetas, por favor pida a su farmacia que se ponga en contacto con nuestra oficina. Harland Dingwall de fax es [place] [phone].  Si tiene un asunto urgente cuando la clnica est cerrada y que no puede esperar hasta el siguiente da hbil, puede llamar/localizar a su doctor(a) al nmero que aparece a continuacin.   Por favor, tenga en cuenta que aunque hacemos todo lo posible para estar  disponibles para asuntos urgentes fuera del horario de [place], no estamos disponibles las 24 horas del da, los 7 [address]  [address] de la [place].   Si tiene un problema urgente y no puede comunicarse con nosotros, puede optar por buscar atencin mdica  en el consultorio de su doctor(a), en una clnica privada, en un centro de atencin urgente o en una sala de emergencias.  Si tiene Engineer, drilling, por favor llame inmediatamente al 911 o vaya a la sala de emergencias.  Nmeros de bper  - Dr. Gwen Pounds: [phone]  - Dra. Moye: [phone]  - Dra. Roseanne Reno: [phone]  En caso de inclemencias del [place], por favor llame a Lacy Duverney principal al [phone] para una actualizacin sobre el [place] de cualquier retraso o cierre.  Consejos para la medicacin en dermatologa: Por favor, guarde las cajas en las que vienen los medicamentos de uso tpico para ayudarle a seguir las instrucciones sobre dnde y cmo usarlos. Las farmacias  generalmente imprimen las instrucciones del medicamento slo en las cajas y no directamente en los tubos del [place].   Si su medicamento es muy caro, por favor, pngase en contacto con Rolm Gala llamando al [phone] y presione la opcin 4 o envenos un mensaje a travs de Clinical cytogeneticist.   No podemos decirle cul ser su copago por los medicamentos por adelantado ya que esto es diferente dependiendo de la cobertura de su seguro. Sin embargo, es posible que podamos encontrar un medicamento sustituto a Audiological scientist un formulario para que el seguro cubra el medicamento que se considera necesario.   Si se requiere una autorizacin previa para que su compaa de seguros [place] su medicamento, por favor permtanos de 1 a 2 das hbiles para completar [address]  Los precios de los medicamentos varan con frecuencia dependiendo del Environmental consultant de dnde se surte la receta y alguna farmacias pueden ofrecer precios ms baratos.  El sitio web www.goodrx.com tiene cupones para medicamentos de Health and safety inspector. Los precios aqu no tienen en cuenta lo que podra costar con la ayuda del seguro (puede ser ms barato con su seguro), pero el sitio web puede darle el precio si no utiliz Tourist information centre manager.  - Puede imprimir el cupn correspondiente y llevarlo con su receta a la farmacia.  - Tambin puede pasar por nuestra oficina durante el horario de atencin regular y Education officer, museum una tarjeta de cupones de GoodRx.  - Si necesita que su receta se enve electrnicamente a una farmacia diferente, informe a nuestra oficina a travs de MyChart de [place] o por telfono llamando al [phone] y presione la opcin 4.

## 2022-04-04 NOTE — Progress Notes (Signed)
Follow-Up Visit   Subjective  Stephen Austin is a 57 y.o. male who presents for the following: Growth (Left cheek, present for years but getting bigger). And irritated by shaving. He also has growths in the scalp he would like checked, present for years, not bothersome.   The following portions of the chart were reviewed this encounter and updated as appropriate:       Review of Systems:  No other skin or systemic complaints except as noted in HPI or Assessment and Plan.  Objective  Well appearing patient in no apparent distress; mood and affect are within normal limits.  A focused examination was performed including face, scalp. Relevant physical exam findings are noted in the Assessment and Plan.  nose x 2, R ear helix x 1, R forehead x 1, L forehead x 2 (6) Pink scaly macules    Assessment & Plan  Actinic Damage - chronic, secondary to cumulative UV radiation exposure/sun exposure over time - diffuse scaly erythematous macules with underlying dyspigmentation - Recommend daily broad spectrum sunscreen SPF 30+ to sun-exposed areas, reapply every 2 hours as needed.  - Recommend staying in the shade or wearing long sleeves, sun glasses (UVA+UVB protection) and wide brim hats (4-inch brim around the entire circumference of the hat). - Call for new or changing lesions.  Seborrheic Keratoses - Stuck-on, waxy, tan-brown papules and/or plaques of the L temporal scalp - Benign-appearing - Discussed benign etiology and prognosis. - Observe - Call for any changes  Inflamed seborrheic keratosis Left Lateral Cheek  Symptomatic, irritating, patient would like treated.  Destruction of lesion - Left Lateral Cheek  Destruction method: cryotherapy   Informed consent: discussed and consent obtained   Lesion destroyed using liquid nitrogen: Yes   Region frozen until ice ball extended beyond lesion: Yes   Outcome: patient tolerated procedure well with no complications   Post-procedure  details: wound care instructions given   Additional details:  Prior to procedure, discussed risks of blister formation, small wound, skin dyspigmentation, or rare scar following cryotherapy. Recommend Vaseline ointment to treated areas while healing.   AK (actinic keratosis) (6) nose x 2, R ear helix x 1, R forehead x 1, L forehead x 2  Actinic keratoses are precancerous spots that appear secondary to cumulative UV radiation exposure/sun exposure over time. They are chronic with expected duration over 1 year. A portion of actinic keratoses will progress to squamous cell carcinoma of the skin. It is not possible to reliably predict which spots will progress to skin cancer and so treatment is recommended to prevent development of skin cancer.  Recommend daily broad spectrum sunscreen SPF 30+ to sun-exposed areas, reapply every 2 hours as needed.  Recommend staying in the shade or wearing long sleeves, sun glasses (UVA+UVB protection) and wide brim hats (4-inch brim around the entire circumference of the hat). Call for new or changing lesions.  Destruction of lesion - nose x 2, R ear helix x 1, R forehead x 1, L forehead x 2  Destruction method: cryotherapy   Informed consent: discussed and consent obtained   Lesion destroyed using liquid nitrogen: Yes   Region frozen until ice ball extended beyond lesion: Yes   Outcome: patient tolerated procedure well with no complications   Post-procedure details: wound care instructions given   Additional details:  Prior to procedure, discussed risks of blister formation, small wound, skin dyspigmentation, or rare scar following cryotherapy. Recommend Vaseline ointment to treated areas while healing.  Return if symptoms worsen or fail to improve.  IJamesetta Orleans, CMA, am acting as scribe for Brendolyn Patty, MD .  Documentation: I have reviewed the above documentation for accuracy and completeness, and I agree with the above.  Brendolyn Patty MD

## 2022-04-15 ENCOUNTER — Other Ambulatory Visit: Payer: Self-pay | Admitting: Neurology

## 2022-05-21 ENCOUNTER — Telehealth: Payer: Self-pay

## 2022-05-21 ENCOUNTER — Encounter: Payer: Self-pay | Admitting: Neurology

## 2022-05-21 NOTE — Telephone Encounter (Signed)
Called patient and informed him to please reach out to the Center that he is having the Focus U/S done at. Patient verbalized understanding and will do so.

## 2022-05-21 NOTE — Telephone Encounter (Addendum)
Patient has an ultrasound scheduled on for 06/28/22 and is wondering if he needs to restrict medications before the [place] or continue taking medications as prescribed leading up to the [place].

## 2022-07-09 NOTE — Progress Notes (Unsigned)
Assessment/Plan:    1.  Essential Tremor  -Patient off of propranolol because could not obtain it because had not been to a primary care for a long time.  I did not want to restart it.  I questioned whether or not syncope was due to propranolol in the past.  In addition, his blood pressure was already quite low today and I did not want to restart it.  -continue primidone 250 mg q hs  -Patient is status post focused ultrasound to the right VIM on June 28, 2022.   2.  History of syncope  -Cardiac work-up was negative.  -If recurs, the plan is to consider long-term Holter.  Subjective:   Stephen Austin was seen today in follow up for essential tremor.  My previous records were reviewed prior to todays visit.  Patient obtained consult at [facility] from Dr. Gladstone Pih on April 17, 2022 for possible focused ultrasound for his tremor.  Patient was deemed to be a good candidate and underwent focused ultrasound June 28, 2022.  Patient experienced some tingling in his tongue and fingers on the left postoperatively and had some symptoms in the left leg.  Current prescribed movement disorder medications: Primidone,250 mg daily  Trihexyphenidyl, 2 mg twice per day. Topamax, 100 mg daily (started last visit)  Prior meds:  artane (dry mouth); propranolol (had syncope and wondered if this contributed - his BP also is low)  ALLERGIES:   No Known Allergies  CURRENT MEDICATIONS:  Outpatient Encounter Medications as of 01/18/2022  Medication Sig   primidone (MYSOLINE) 250 MG tablet TAKE 1 TABLET BY MOUTH EVERY DAY   propranolol (INDERAL) 20 MG tablet Take 20 mg by mouth 2 (two) times daily. Had been taken it daily (Patient not taking: Reported on 01/18/2022)   topiramate (TOPAMAX) 100 MG tablet Take 1 tablet (100 mg total) by mouth daily. (Patient not taking: Reported on 01/18/2022)   topiramate (TOPAMAX) 25 MG tablet 1 po qd x 1 week; 2 po q day x 1 week; 3 po q day x 1 week (Patient not taking: Reported on  01/18/2022)   trihexyphenidyl (ARTANE) 2 MG tablet Take 1 tablet (2 mg total) by mouth 2 (two) times daily with a meal. (Patient not taking: Reported on 07/13/2021)   No facility-administered encounter medications on file as of 01/18/2022.     Objective:    PHYSICAL EXAMINATION:    VITALS:   Vitals:   01/18/22 1446  BP: 117/78  Pulse: 74  SpO2: 98%  Weight: 197 lb 12.8 oz (89.7 kg)  Height:  (1.778 m)      GEN:  The patient appears stated age and is in NAD. HEENT:  Normocephalic, atraumatic.  The mucous membranes are moist. Cardiovascular: Regular rate rhythm Lungs: Clear to auscultation bilaterally Neck: No bruits  Neurological examination:  Orientation: The patient is alert and oriented x3. Cranial nerves: There is good facial symmetry. The speech is fluent and clear. Soft palate rises symmetrically and there is no tongue deviation. Hearing is intact to conversational tone. Sensation: Sensation is intact to light touch throughout Motor: Strength is at least antigravity x4.  Movement examination: Tone: There is normal tone in the UE/LE Abnormal movements: No rest tremor.  He has mild to moderate postural tremor on the left.  He has some trouble with Archimedes spirals on the left. Coordination:  There is no decremation with RAM's    Total time spent on today's visit was *** minutes, including both face-to-face  time and nonface-to-face time.  Time included that spent on review of records (prior notes available to me/labs/imaging if pertinent), discussing treatment and goals, answering patient's questions and coordinating care.  Cc:  Lauro Regulus, MD

## 2022-07-11 ENCOUNTER — Ambulatory Visit (INDEPENDENT_AMBULATORY_CARE_PROVIDER_SITE_OTHER): Payer: PRIVATE HEALTH INSURANCE | Admitting: Neurology

## 2022-07-11 VITALS — BP 124/82 | HR 67 | Ht 70.0 in | Wt 217.4 lb

## 2022-07-11 DIAGNOSIS — G250 Essential tremor: Secondary | ICD-10-CM

## 2022-07-12 ENCOUNTER — Ambulatory Visit: Payer: PRIVATE HEALTH INSURANCE | Admitting: Neurology

## 2022-07-19 ENCOUNTER — Ambulatory Visit: Payer: PRIVATE HEALTH INSURANCE | Admitting: Neurology

## 2022-07-22 ENCOUNTER — Other Ambulatory Visit: Payer: Self-pay | Admitting: Neurology

## 2022-07-24 ENCOUNTER — Ambulatory Visit: Payer: PRIVATE HEALTH INSURANCE | Admitting: Dermatology

## 2023-03-01 ENCOUNTER — Ambulatory Visit: Payer: PRIVATE HEALTH INSURANCE

## 2023-03-01 DIAGNOSIS — Z1211 Encounter for screening for malignant neoplasm of colon: Secondary | ICD-10-CM | POA: Diagnosis present
